# Patient Record
Sex: Male | Born: 2000 | Race: Black or African American | Hispanic: No | Marital: Single | Smoking: Never smoker
Health system: Southern US, Community
[De-identification: ages and names within clinical notes are randomized; demographics above are authoritative.]

---

## 2022-03-09 ENCOUNTER — Emergency Department (HOSPITAL_COMMUNITY)
Admission: EM | Admit: 2022-03-09 | Discharge: 2022-03-09 | Disposition: A | Discharge status: Home / Self Care | Payer: Medicaid Other | Attending: Emergency Medicine | Admitting: Emergency Medicine

## 2022-03-09 ENCOUNTER — Emergency Department (HOSPITAL_COMMUNITY): Payer: Medicaid Other

## 2022-03-09 ENCOUNTER — Encounter (HOSPITAL_COMMUNITY): Payer: Self-pay | Admitting: Emergency Medicine

## 2022-03-09 ENCOUNTER — Other Ambulatory Visit: Payer: Self-pay

## 2022-03-09 DIAGNOSIS — M791 Myalgia, unspecified site: Secondary | ICD-10-CM | POA: Diagnosis not present

## 2022-03-09 DIAGNOSIS — M84362A Stress fracture, left tibia, initial encounter for fracture: Secondary | ICD-10-CM | POA: Diagnosis not present

## 2022-03-09 DIAGNOSIS — M79662 Pain in left lower leg: Secondary | ICD-10-CM | POA: Diagnosis present

## 2022-03-09 DIAGNOSIS — M7918 Myalgia, other site: Secondary | ICD-10-CM

## 2022-03-09 DIAGNOSIS — Y92410 Unspecified street and highway as the place of occurrence of the external cause: Secondary | ICD-10-CM | POA: Insufficient documentation

## 2022-03-09 DIAGNOSIS — M542 Cervicalgia: Secondary | ICD-10-CM | POA: Diagnosis not present

## 2022-03-09 DIAGNOSIS — R1032 Left lower quadrant pain: Secondary | ICD-10-CM | POA: Insufficient documentation

## 2022-03-09 DIAGNOSIS — R072 Precordial pain: Secondary | ICD-10-CM | POA: Insufficient documentation

## 2022-03-09 MED ORDER — BACITRACIN ZINC 500 UNIT/GM EX OINT: TOPICAL_OINTMENT | Freq: Two times a day (BID) | CUTANEOUS | Status: DC
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
